# Patient Record
Sex: Male | Born: 1980 | Race: White | Hispanic: No | Marital: Single | Smoking: Current every day smoker
Health system: Southern US, Community
[De-identification: ages and names within clinical notes are randomized; demographics above are authoritative.]

---

## 2017-01-07 ENCOUNTER — Emergency Department: Payer: Worker's Compensation

## 2017-01-07 ENCOUNTER — Emergency Department
Admission: EM | Admit: 2017-01-07 | Discharge: 2017-01-08 | Disposition: A | Discharge status: Home / Self Care | Payer: Worker's Compensation | Attending: Emergency Medicine | Admitting: Emergency Medicine

## 2017-01-07 DIAGNOSIS — Y9389 Activity, other specified: Secondary | ICD-10-CM | POA: Insufficient documentation

## 2017-01-07 DIAGNOSIS — F17200 Nicotine dependence, unspecified, uncomplicated: Secondary | ICD-10-CM | POA: Diagnosis not present

## 2017-01-07 DIAGNOSIS — S0990XA Unspecified injury of head, initial encounter: Secondary | ICD-10-CM

## 2017-01-07 DIAGNOSIS — Y92411 Interstate highway as the place of occurrence of the external cause: Secondary | ICD-10-CM | POA: Diagnosis not present

## 2017-01-07 DIAGNOSIS — Y990 Civilian activity done for income or pay: Secondary | ICD-10-CM | POA: Insufficient documentation

## 2017-01-07 DIAGNOSIS — M25512 Pain in left shoulder: Secondary | ICD-10-CM | POA: Diagnosis not present

## 2017-01-07 DIAGNOSIS — S060X1A Concussion with loss of consciousness of 30 minutes or less, initial encounter: Secondary | ICD-10-CM | POA: Diagnosis not present

## 2017-01-07 LAB — CBC WITH DIFFERENTIAL/PLATELET
BASOS PCT: 1 %
Basophils Absolute: 0.1 10*3/uL (ref 0–0.1)
Eosinophils Absolute: 0.2 10*3/uL (ref 0–0.7)
Eosinophils Relative: 1 %
HEMATOCRIT: 46.7 % (ref 40.0–52.0)
Hemoglobin: 16.4 g/dL (ref 13.0–18.0)
LYMPHS PCT: 14 %
Lymphs Abs: 2.2 10*3/uL (ref 1.0–3.6)
MCH: 32.2 pg (ref 26.0–34.0)
MCHC: 35.1 g/dL (ref 32.0–36.0)
MCV: 91.8 fL (ref 80.0–100.0)
MONO ABS: 1 10*3/uL (ref 0.2–1.0)
MONOS PCT: 6 %
NEUTROS ABS: 12.7 10*3/uL — AB (ref 1.4–6.5)
NEUTROS PCT: 78 %
Platelets: 241 10*3/uL (ref 150–440)
RBC: 5.09 MIL/uL (ref 4.40–5.90)
RDW: 12.9 % (ref 11.5–14.5)
WBC: 16.3 10*3/uL — ABNORMAL HIGH (ref 3.8–10.6)

## 2017-01-07 LAB — COMPREHENSIVE METABOLIC PANEL
ALBUMIN: 5.3 g/dL — AB (ref 3.5–5.0)
ALK PHOS: 57 U/L (ref 38–126)
ALT: 27 U/L (ref 17–63)
ANION GAP: 10 (ref 5–15)
AST: 25 U/L (ref 15–41)
BILIRUBIN TOTAL: 0.7 mg/dL (ref 0.3–1.2)
BUN: 10 mg/dL (ref 6–20)
CALCIUM: 9.8 mg/dL (ref 8.9–10.3)
CO2: 25 mmol/L (ref 22–32)
Chloride: 102 mmol/L (ref 101–111)
Creatinine, Ser: 0.92 mg/dL (ref 0.61–1.24)
GFR calc Af Amer: >60 mL/min (ref >60)
GFR calc non Af Amer: >60 mL/min (ref >60)
GLUCOSE: 104 mg/dL — AB (ref 65–99)
Potassium: 4.1 mmol/L (ref 3.5–5.1)
Sodium: 137 mmol/L (ref 135–145)
TOTAL PROTEIN: 8.4 g/dL — AB (ref 6.5–8.1)

## 2017-01-07 MED ORDER — TRAMADOL HCL 50 MG PO TABS: 50.0000 mg | ORAL_TABLET | Freq: Four times a day (QID) | ORAL | 0 refills | Status: AC | PRN
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
## 2017-01-07 NOTE — ED Notes (Addendum)
[facility], no profile on file.  Pt's supervisor present, Raymond Sawyer, states no UDS or breathlyzer at this time.    Supervisor requesting call w/ updates [phone]
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
## 2017-01-07 NOTE — ED Notes (Signed)
Pt's left arm/shoulder immobilized via shoulder sling by this RN

## 2017-01-07 NOTE — ED Notes (Signed)
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
